# Patient Record
Sex: Female | Born: 1985 | Race: White | Hispanic: No | Marital: Married
Health system: Southern US, Community
[De-identification: ages and names within clinical notes are randomized; demographics above are authoritative.]

---

## 2004-11-02 ENCOUNTER — Other Ambulatory Visit: Admission: RE | Admit: 2004-11-02 | Discharge: 2004-11-02 | Payer: Self-pay | Admitting: Family Medicine

## 2005-09-20 ENCOUNTER — Other Ambulatory Visit: Admission: RE | Admit: 2005-09-20 | Discharge: 2005-09-20 | Payer: Self-pay | Admitting: Obstetrics and Gynecology

## 2005-09-23 ENCOUNTER — Encounter: Admission: RE | Admit: 2005-09-23 | Discharge: 2005-09-23 | Payer: Self-pay | Admitting: Obstetrics and Gynecology

## 2007-10-10 ENCOUNTER — Encounter: Admission: RE | Admit: 2007-10-10 | Discharge: 2007-10-10 | Payer: Self-pay | Admitting: Obstetrics and Gynecology

## 2013-08-25 ENCOUNTER — Observation Stay: Payer: Self-pay | Admitting: Obstetrics and Gynecology

## 2013-08-25 LAB — COMPREHENSIVE METABOLIC PANEL
ALBUMIN: 3.3 g/dL — AB (ref 3.4–5.0)
ALT: 22 U/L (ref 12–78)
ANION GAP: 10 (ref 7–16)
AST: 21 U/L (ref 15–37)
Alkaline Phosphatase: 53 U/L
BUN: 10 mg/dL (ref 7–18)
Bilirubin,Total: 0.4 mg/dL (ref 0.2–1.0)
CO2: 22 mmol/L (ref 21–32)
CREATININE: 1.04 mg/dL (ref 0.60–1.30)
Calcium, Total: 8.5 mg/dL (ref 8.5–10.1)
Chloride: 103 mmol/L (ref 98–107)
EGFR (African American): >60
EGFR (Non-African Amer.): >60
Glucose: 173 mg/dL — ABNORMAL HIGH (ref 65–99)
OSMOLALITY: 273 (ref 275–301)
POTASSIUM: 3.6 mmol/L (ref 3.5–5.1)
SODIUM: 135 mmol/L — AB (ref 136–145)
Total Protein: 6.3 g/dL — ABNORMAL LOW (ref 6.4–8.2)

## 2013-08-25 LAB — CBC WITH DIFFERENTIAL/PLATELET
Basophil #: 0.1 10*3/uL (ref 0.0–0.1)
Basophil #: 0 10*3/uL (ref 0.0–0.1)
Basophil %: 0.7 %
Basophil %: 0.1 %
EOS ABS: 0.2 10*3/uL (ref 0.0–0.7)
EOS ABS: 0 10*3/uL (ref 0.0–0.7)
Eosinophil %: 1 %
Eosinophil %: 0 %
HCT: 32 % — AB (ref 35.0–47.0)
HCT: 19.4 % — ABNORMAL LOW (ref 35.0–47.0)
HGB: 11.1 g/dL — ABNORMAL LOW (ref 12.0–16.0)
HGB: 6.8 g/dL — ABNORMAL LOW (ref 12.0–16.0)
LYMPHS ABS: 3.2 10*3/uL (ref 1.0–3.6)
Lymphocyte #: 1.1 10*3/uL (ref 1.0–3.6)
Lymphocyte %: 20.9 %
Lymphocyte %: 7.7 %
MCH: 29.9 pg (ref 26.0–34.0)
MCH: 30.1 pg (ref 26.0–34.0)
MCHC: 34.6 g/dL (ref 32.0–36.0)
MCHC: 34.9 g/dL (ref 32.0–36.0)
MCV: 87 fL (ref 80–100)
MCV: 86 fL (ref 80–100)
MONOS PCT: 4.1 %
Monocyte #: 0.6 x10 3/mm (ref 0.2–0.9)
Monocyte #: 0.5 x10 3/mm (ref 0.2–0.9)
Monocyte %: 3.5 %
Neutrophil #: 11.3 10*3/uL — ABNORMAL HIGH (ref 1.4–6.5)
Neutrophil #: 12.9 10*3/uL — ABNORMAL HIGH (ref 1.4–6.5)
Neutrophil %: 73.3 %
Neutrophil %: 88.7 %
PLATELETS: 414 10*3/uL (ref 150–440)
PLATELETS: 223 10*3/uL (ref 150–440)
RBC: 3.7 10*6/uL — ABNORMAL LOW (ref 3.80–5.20)
RBC: 2.25 10*6/uL — ABNORMAL LOW (ref 3.80–5.20)
RDW: 12.4 % (ref 11.5–14.5)
RDW: 12.2 % (ref 11.5–14.5)
WBC: 15.4 10*3/uL — AB (ref 3.6–11.0)
WBC: 14.5 10*3/uL — AB (ref 3.6–11.0)

## 2013-08-25 LAB — URINALYSIS, COMPLETE
BACTERIA: NONE SEEN
BLOOD: NEGATIVE
Bilirubin,UR: NEGATIVE
Glucose,UR: NEGATIVE mg/dL (ref 0–75)
Hyaline Cast: 17
KETONE: NEGATIVE
Leukocyte Esterase: NEGATIVE
Nitrite: NEGATIVE
PH: 7 (ref 4.5–8.0)
PROTEIN: NEGATIVE
RBC,UR: 3 /HPF (ref 0–5)
SPECIFIC GRAVITY: 1.017 (ref 1.003–1.030)
WBC UR: NONE SEEN /HPF (ref 0–5)

## 2013-08-25 LAB — HCG, QUANTITATIVE, PREGNANCY: Beta Hcg, Quant.: 21796 m[IU]/mL — ABNORMAL HIGH

## 2013-08-25 LAB — LIPASE, BLOOD: LIPASE: 86 U/L (ref 73–393)

## 2013-08-26 LAB — CBC WITH DIFFERENTIAL/PLATELET
BASOS ABS: 0 10*3/uL (ref 0.0–0.1)
Basophil %: 0.4 %
EOS ABS: 0 10*3/uL (ref 0.0–0.7)
EOS PCT: 0.2 %
HCT: 22.3 % — AB (ref 35.0–47.0)
HGB: 7.6 g/dL — AB (ref 12.0–16.0)
LYMPHS ABS: 1.6 10*3/uL (ref 1.0–3.6)
Lymphocyte %: 13.2 %
MCH: 29.8 pg (ref 26.0–34.0)
MCHC: 34.3 g/dL (ref 32.0–36.0)
MCV: 87 fL (ref 80–100)
Monocyte #: 0.5 x10 3/mm (ref 0.2–0.9)
Monocyte %: 4.4 %
Neutrophil #: 9.9 10*3/uL — ABNORMAL HIGH (ref 1.4–6.5)
Neutrophil %: 81.8 %
Platelet: 185 10*3/uL (ref 150–440)
RBC: 2.57 10*6/uL — AB (ref 3.80–5.20)
RDW: 12.8 % (ref 11.5–14.5)
WBC: 12.2 10*3/uL — ABNORMAL HIGH (ref 3.6–11.0)

## 2013-08-29 LAB — PATHOLOGY REPORT

## 2014-12-06 NOTE — H&P (Signed)
PATIENT NAME:  Tracey Gonzales, Tracey Gonzales MR#:  [id number] DATE OF BIRTH:  May 15, 1986  DATE OF ADMISSION:  08/25/2013  HISTORY OF PRESENT ILLNESS: A 29 year old gravida 0 presented to the Emergency Department with 1 day of acute-onset lower abdominal pain nourished. Emergency physician evaluated the patient and ordered an ultrasound. I was called in as a Research scientist (medical)consultant. The patient states that her pain is central and escalating throughout the day. She did have similar pain about 10 days ago, which dissipated. The patient's last menstrual period first December and had 2 small bleeding episodes 2 other times since then. Quantitative hCG 21,796. Ultrasound shows an empty uterus, no gestational sac, 8 x 5 cm mass superior to the uterus consistent with a hematoma. The patient's blood type is A+ and the patient denies vaginal bleeding.   PAST MEDICAL HISTORY: Unremarkable.   PAST SURGICAL HISTORY: Oral surgery.   REVIEW OF SYSTEMS: Unremarkable.   FAMILY HISTORY: Unremarkable.   MEDICATIONS: None.   ALLERGIES: SULFA.   SOCIAL HISTORY: Drinks socially and does smoke tobacco.   PHYSICAL EXAMINATION: GENERAL: Well-developed, well-nourished white female, slightly pale-appearing.  VITAL SIGNS:  Blood pressure 115/55, pulse 74, respirations 18.  LUNGS: Clear to auscultation.  CARDIOVASCULAR: Regular rate and rhythm. Slightly distended tender to palpation. Slight rebound.  PELVIC: Examination is deferred.   LABORATORY STUDIES: White blood count 15,000, hematocrit 32.0%, platelets 414,000. Chemistry panel: Sodium 135, potassium 3.6, chloride 104, CO2 22. BUN and creatinine 10 and 1.0 respectively.    ASSESSMENT: Ruptured ectopic pregnancy.   PLAN:  Spoke to the patient regarding the significance of this and the need for surgical intervention I recommend a laparoscopic evaluation with possible salpingostomy versus salpingectomy versus oophorectomy if this is an ovarian ectopic. The patient is aware of potential  complications from the procedure including other organ injury, bowel, bladder, ureters; risk of blood transfusion and the risk of infectious disease if blood is administered, anesthetic complications. The patient understands these risks and consents to the procedure.   ____________________________ Suzy Bouchardhomas J. Schermerhorn, MD tjs:cs D: 08/25/2013 14:19:06 ET T: 08/25/2013 14:45:39 ET JOB#: [id number]  cc: Suzy Bouchardhomas J. Schermerhorn, MD, <Dictator> Suzy BouchardHOMAS J SCHERMERHORN MD ELECTRONICALLY SIGNED 08/30/2013 8:36

## 2014-12-06 NOTE — Op Note (Signed)
PATIENT NAME:  Tracey Gonzales, Tracey Gonzales MR#:  [id number] DATE OF BIRTH:  12-17-1985  DATE OF PROCEDURE:  08/25/2013  PREOPERATIVE DIAGNOSES:  1.  Postoperative bleeding.  2.  Anemia.   POSTOPERATIVE DIAGNOSIS: Subcutaneous arterial bleed.   PROCEDURE:  1.  Diagnostic laparoscopy. 2.  Cautery of subcutaneous arterial bleed.  ANESTHESIA: General endotracheal anesthesia.   SURGEON: Jennell Cornerhomas Schermerhorn, MD.  INDICATION: This is a 29 year old, gravida 0 patient who is 5 hours postop for laparoscopic right salpingectomy for a ruptured ectopic pregnancy. The patient developed left-sided subcutaneous hematoma that was noted on the postop floor. CBC was done that showed a hematocrit of 19%. Given hematoma subcutaneously and low hematocrit,  surgeon felt it was necessary to re-explore the patient to make sure she did not have an intra-abdominal bleed that was tracking upwards into the subcutaneous spaces.   PROCEDURE: After adequate general endotracheal anesthesia, the patient did receive 2 grams IV Ancef. The patient was prepped and draped in normal sterile fashion. Foley catheter was placed. A sponge stick was placed in the vagina to use for uterine manipulation during the procedure. The left lower port site was opened and the infraumbilical incision was also opened and the laparoscope was advanced back into the abdominal cavity with the Optiview cannula. The patient's abdomen was insufflated. There was 30 mL of non-clotted blood in the lower abdomen. The surgical sites appeared hemostatic. There was no active bleeding from the salpingectomy site or the left ovarian cystotomy site. No active bleeding was seen coming from the peritoneal side of the lower port sites either. The fascial stitch was opened on the left lower port site and there was an extension of the previously placed incision to about 4 cm. Retractors were used to look at the subcutaneous area and the fascial and subfascial area. There was a small  arterial bleed that was superior to the fascia that was noted. This was cauterized. Several figure-of-eight 3-0 Vicryl stitches were placed in the subcutaneous tissues to aid in hemostasis. There was no active bleeding at that point.   At that point, the cannula was re-advanced through the fascial site and the patient's abdomen was copiously irrigated and suctioned and all surgical spots were re-evaluated. Pressure was lowered. There was no active bleeding coming around the port sites either. The left lower port site was removed and the fascial edges were temporarily grasped with a 2-0 Vicryl suture and placed at rest and again intra-abdominal evaluation showed no additional bleeding with the port site out. Kleppingers were then used to gain entrance into the peritoneal side of the left port site and cautery was used within that site. Good hemostasis again was noted. Arista was then placed into the remaining defect of the peritoneal left lower port site and the right lower port site was removed, intra-abdominal pressure was lowered and again no active bleeding noted. All instruments were removed and the patient's left incision was reinspected and irrigated after the fascia was closed with a 2-0 Vicryl suture. Arista was placed at the base of the incision and the skin was reapproximated with staples. Infraumbilical incision was closed to the deep layer and a subcutaneous interrupted 4-0 Vicryl skin layer. The right lower quadrant port site skin was reapproximated with a 4-0 Vicryl suture. Benzoin was applied. Steri-Strips were applied. Sterile dressings applied. Sponge stick was removed. There were no complications. The patient did receive 1 unit of packed red blood cells. Her vital signs remained stable throughout the procedure. Estimated blood loss 50 mL.  Intraoperative fluids 1400 mL. Urine output 200 mL. The patient tolerated the procedure well and was taken to the recovery room in good condition.    ____________________________ Suzy Bouchard, MD tjs:aw D: 08/25/2013 23:12:09 ET T: 08/26/2013 10:20:57 ET JOB#: [id number]  cc: Suzy Bouchard, MD, <Dictator> Suzy Bouchard MD ELECTRONICALLY SIGNED 08/30/2013 8:35

## 2014-12-06 NOTE — Op Note (Signed)
PATIENT NAME:  Tracey Gonzales, Verdelle MR#:  [id number] DATE OF BIRTH:  1985-10-29  DATE OF PROCEDURE:  08/25/2013  PREOPERATIVE DIAGNOSIS: Ectopic pregnancy.  POSTOPERATIVE DIAGNOSES: 1.  Ruptured right tubal ectopic pregnancy.  2.  Left ovarian cyst.   PROCEDURES:  1.  Laparoscopic right salpingectomy. 2.  Left ovarian cystotomy.  SURGEON: Jennell Cornerhomas Schermerhorn, MD  ANESTHESIA: General endotracheal.  INDICATION: A 29 year old gravida 0, a patient with acute onset of abdominal pain and a quantitative hCG of 21,000 and an empty uterus and free fluid noted on ultrasound.   DESCRIPTION OF PROCEDURE: After adequate general endotracheal anesthesia, the patient was placed in the dorsal supine position, legs placed in the [place] stirrups. Abdomen and vagina were prepped in normal sterile fashion. A single-tooth tenaculum was applied on the anterior cervix and a Kahn cannula placed in the endocervical canal to be used for uterine manipulation during the procedure. Foley catheter placed into the bladder. Gloves were changed and a 12 mm infraumbilical incision was made. The laparoscope was advanced into the abdominal cavity under direct visualization with the Optiview cannula. A second port was placed in the left lower quadrant 2 cm medial to the left anterior iliac spine under direct visualization. A 10 mm port was advanced into the abdominal cavity. A third port was placed in the right lower quadrant, again 2 cm medial to the right anterior iliac spine and under direct visualization 5 mm port was advanced. All (Dictation Anomaly) <port sites> were pre-injected with 0.5% Marcaine. Initial impression revealed a large amount of clotted blood in the abdomen. The patient was placed in Trendelenburg. Blood clot was suctioned and left fallopian tube appeared normal. There was a 3 x 3 cm simple left ovarian cyst that was noted. On the right fallopian tube there was rupture of the mid to distal portion of the fallopian  tube with active bleeding. The right fallopian tube was grasped at the fimbriated end and placed on medial traction. The mesosalpinx was cauterized with Kleppingers and the Harmonic scalpel was used to remove the right fallopian tube. Good hemostasis noted. The fallopian tube was then removed through the left lower port site. The patient's abdomen was copiously irrigated. A left ovarian cystotomy incision was made with the Harmonic scalpel and straw-colored fluid resulted. No other abnormality was noted within the abdominal cavity. Pressure was lowered to 6 mm of pressure and good hemostasis was noted. Intra-abdominal pictures were taken. The cannulas were removed and the infraumbilical incision and the left lower port site were closed in a deep fascial layer with 2-0 Vicryl suture and then all 3 skin incisions were closed with interrupted 4-0 Vicryl suture. Steri-Strips applied and pressure dressing applied and Tegaderm applied. The Kahn cannula and single-tooth tenaculum were removed from the cervix. There was no active bleeding. Estimated blood loss 400 mL, hemoperitoneum with 200 additional mL during the procedure equaling 600 mL total. Urine output 200 mL and the intraoperative fluids 1500 mL. The patient was taken to the recovery room in good condition.  ____________________________ Suzy Bouchardhomas J. Schermerhorn, MD tjs:sb D: 08/25/2013 16:34:02 ET T: 08/26/2013 09:32:05 ET JOB#: [id number]  cc: Suzy Bouchardhomas J. Schermerhorn, MD, <Dictator> Suzy BouchardHOMAS J SCHERMERHORN MD ELECTRONICALLY SIGNED 08/30/2013 8:35

## 2015-01-30 IMAGING — US US OB < 14 WEEKS - US OB TV
1 series · 14 of 28 positions shown · non-contrast
Comparison: None.

CLINICAL DATA: Severe pelvic pain.  Positive pregnancy test.

EXAM:
OBSTETRIC <14 WK US AND TRANSVAGINAL OB US
TECHNIQUE: Both transabdominal and transvaginal ultrasound examinations were
performed for complete evaluation of the gestation as well as the
maternal uterus, adnexal regions, and pelvic cul-de-sac.
Transvaginal technique was performed to assess early pregnancy.

[Series 1: us ob < 14 weeks - us ob tv · 0.23mm/px · 14 of 90 slices shown]
[im 4/90]
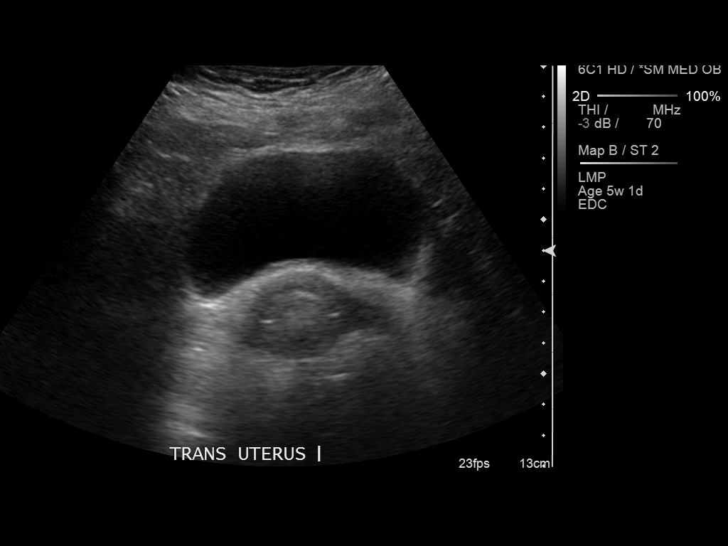
[im 10/90]
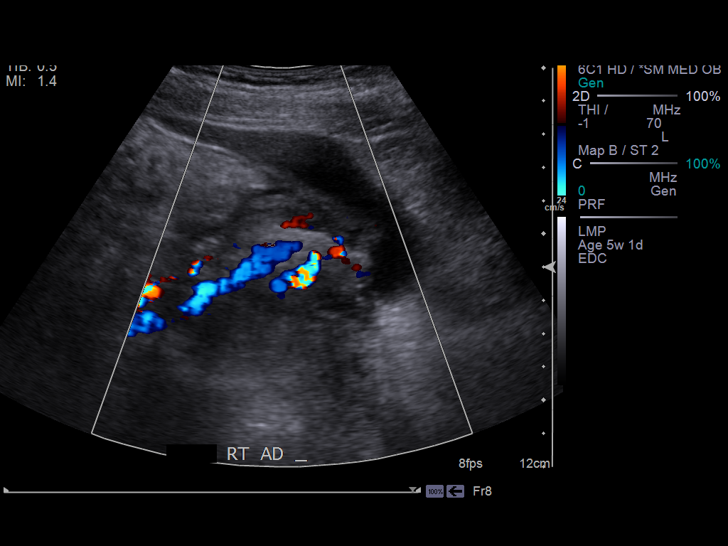
[im 17/90]
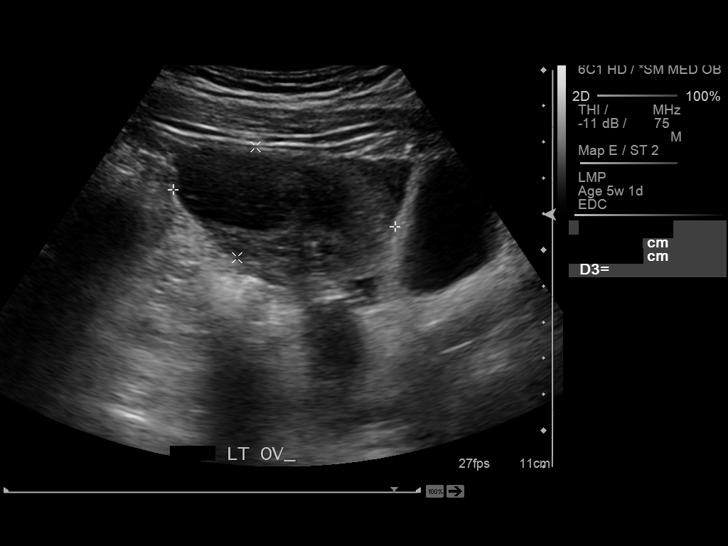
[im 24/90]
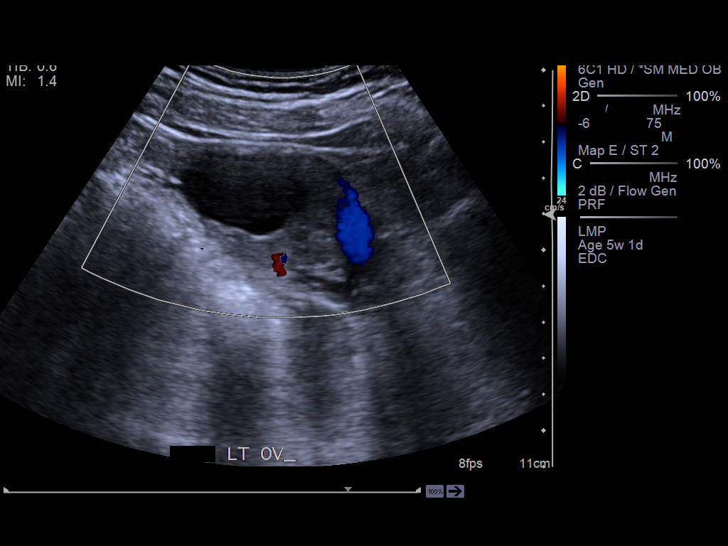
[im 30/90]
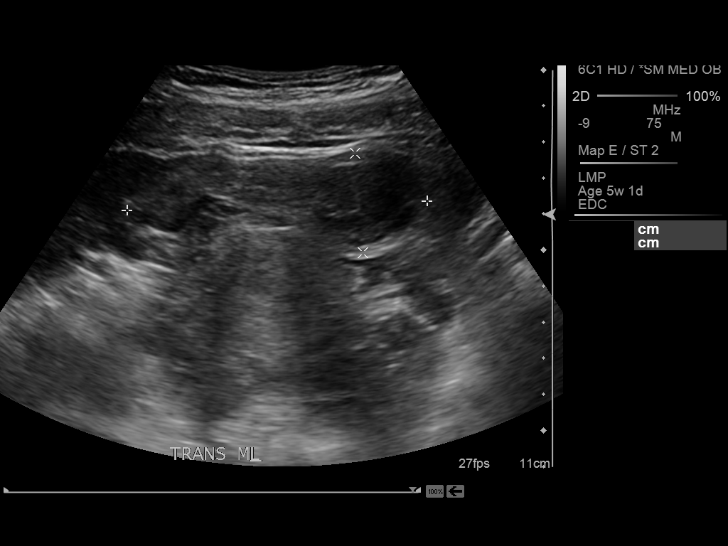
[im 37/90]
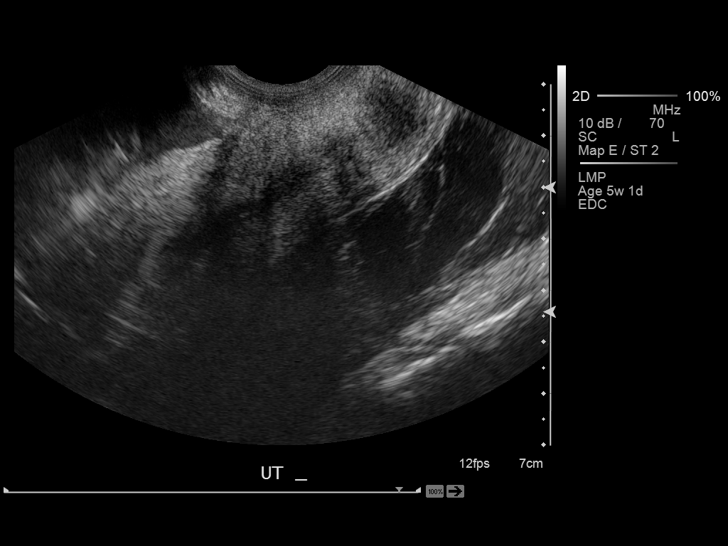
[im 43/90]
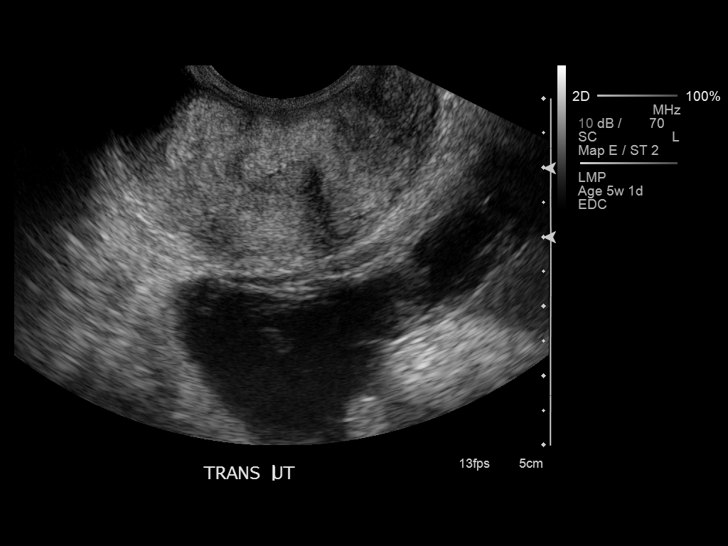
[im 50/90]
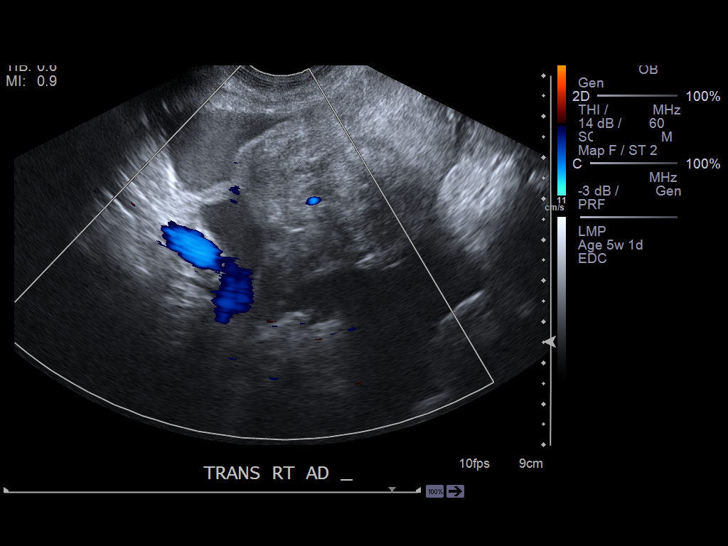
[im 57/90]
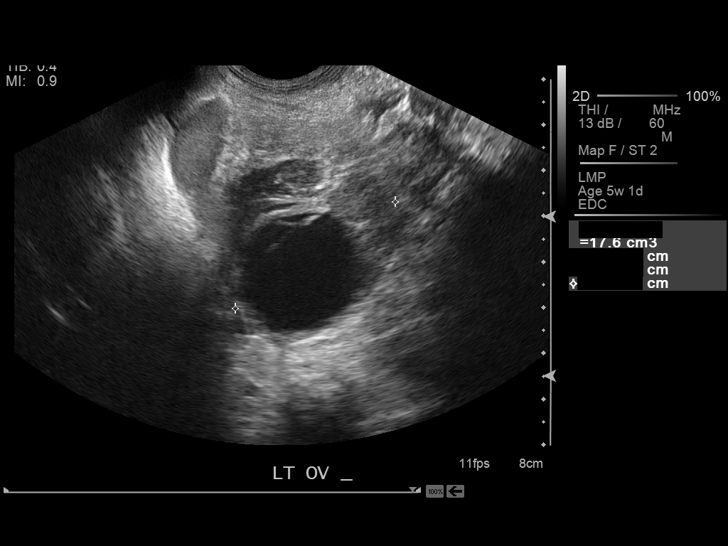
[im 63/90]
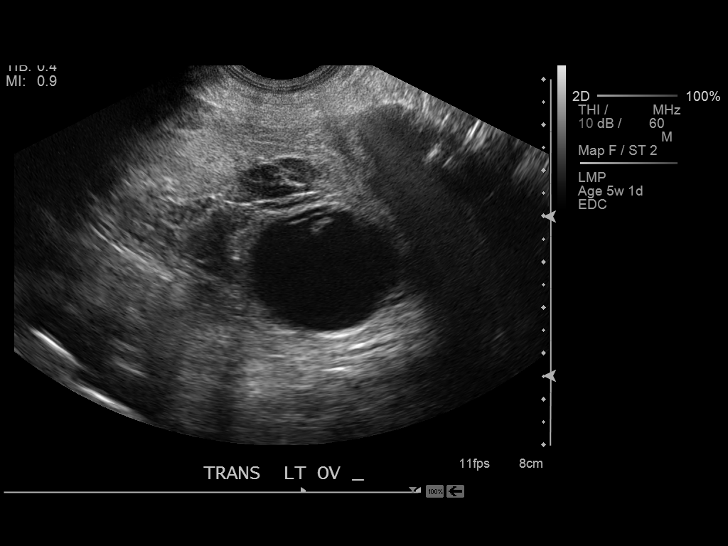
[im 70/90]
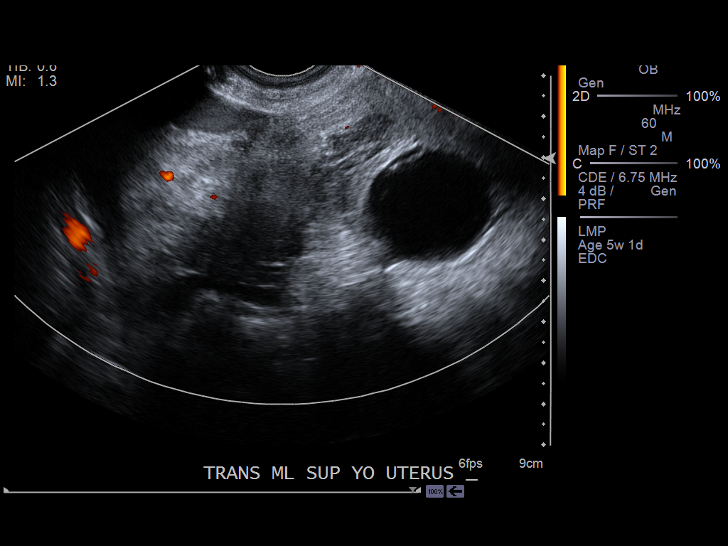
[im 76/90]
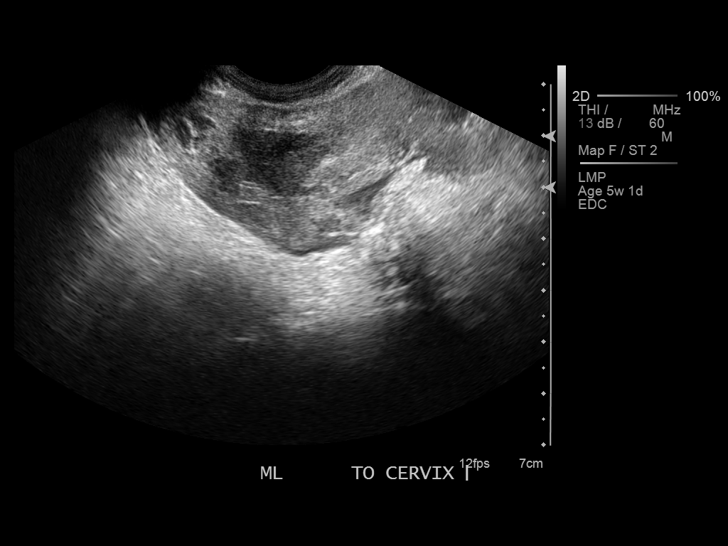
[im 83/90]
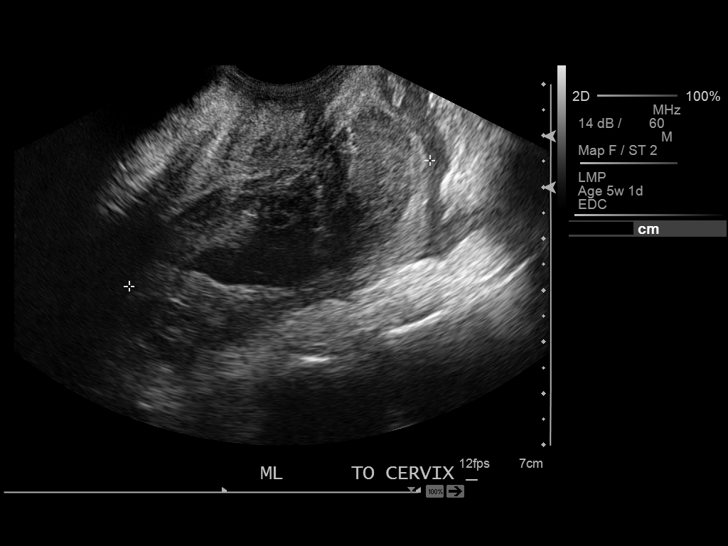
[im 90/90]
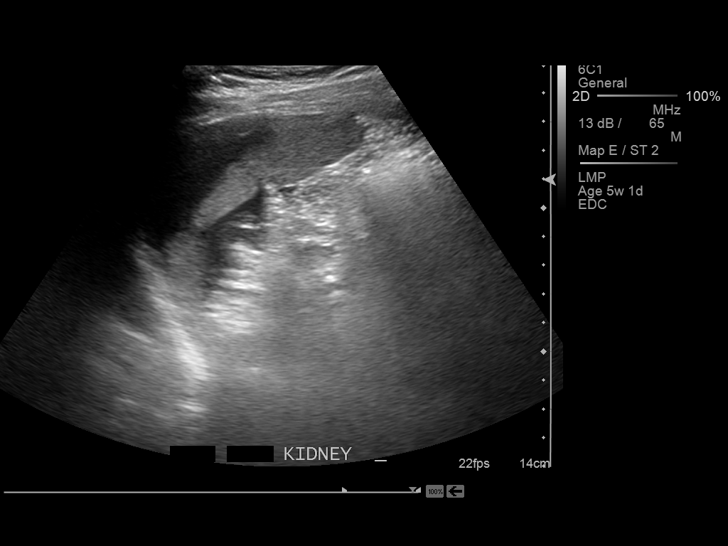

[14 of 28 positions shown; findings below may reference images not displayed]

FINDINGS: Intrauterine gestational sac: None.

Yolk sac:  N/A

Embryo:  N/A a

Cardiac Activity: N/A

Maternal uterus/adnexae:

The right ovary is not visualized.

The left ovary contains a septated cyst measuring 3.6 x 2.0 x
cm.

There is a large complex heterogeneous mass in the midline just
superior to the uterus measuring 8.3 x 2.8 x 5.6 cm. Some blood flow
is noted.

There is a large complex pelvic fluid collection, likely hematoma.
IMPRESSION: Ultrasound findings consistent with a ruptured ectopic pregnancy
with a large amount of hemoperitoneum in the abdomen and pelvis.

These results were called by telephone at the time of interpretation
on 08/25/2013 at [DATE] to ED charge nurse, who verbally
acknowledged these results.
